# Patient Record
Sex: Male | Born: 1965 | Race: White | Hispanic: No | Marital: Single | Smoking: Current every day smoker
Health system: Southern US, Community
[De-identification: ages and names within clinical notes are randomized; demographics above are authoritative.]

## PROBLEM LIST (undated history)

## (undated) DIAGNOSIS — F419 Anxiety disorder, unspecified: Secondary | ICD-10-CM

## (undated) DIAGNOSIS — H409 Unspecified glaucoma: Secondary | ICD-10-CM

## (undated) DIAGNOSIS — M549 Dorsalgia, unspecified: Secondary | ICD-10-CM

## (undated) HISTORY — PX: NO PAST SURGERIES: SHX2092

---

## 2009-05-05 ENCOUNTER — Ambulatory Visit: Payer: Self-pay | Admitting: Internal Medicine

## 2009-05-10 ENCOUNTER — Ambulatory Visit: Payer: Self-pay | Admitting: Internal Medicine

## 2014-11-22 ENCOUNTER — Encounter: Payer: Self-pay | Admitting: *Deleted

## 2014-11-22 DIAGNOSIS — S3992XA Unspecified injury of lower back, initial encounter: Secondary | ICD-10-CM | POA: Diagnosis not present

## 2014-11-22 DIAGNOSIS — S4991XA Unspecified injury of right shoulder and upper arm, initial encounter: Secondary | ICD-10-CM | POA: Diagnosis present

## 2014-11-22 DIAGNOSIS — S40211A Abrasion of right shoulder, initial encounter: Secondary | ICD-10-CM | POA: Diagnosis not present

## 2014-11-22 DIAGNOSIS — Z720 Tobacco use: Secondary | ICD-10-CM | POA: Diagnosis not present

## 2014-11-22 DIAGNOSIS — Y9355 Activity, bike riding: Secondary | ICD-10-CM | POA: Diagnosis not present

## 2014-11-22 DIAGNOSIS — Y998 Other external cause status: Secondary | ICD-10-CM | POA: Diagnosis not present

## 2014-11-22 DIAGNOSIS — S42114A Nondisplaced fracture of body of scapula, right shoulder, initial encounter for closed fracture: Secondary | ICD-10-CM | POA: Insufficient documentation

## 2014-11-22 DIAGNOSIS — Y92410 Unspecified street and highway as the place of occurrence of the external cause: Secondary | ICD-10-CM | POA: Insufficient documentation

## 2014-11-22 MED ORDER — OXYCODONE-ACETAMINOPHEN 5-325 MG PO TABS
ORAL_TABLET | ORAL | Status: AC
  Administered 2014-11-22: 1 via ORAL
  Filled 2014-11-22: qty 1

## 2014-11-22 MED ORDER — OXYCODONE-ACETAMINOPHEN 5-325 MG PO TABS
1.0000 | ORAL_TABLET | Freq: Once | ORAL | Status: AC
  Administered 2014-11-22: 1 via ORAL
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
## 2014-11-23 ENCOUNTER — Emergency Department: Payer: BLUE CROSS/BLUE SHIELD

## 2014-11-23 ENCOUNTER — Emergency Department
Admission: EM | Admit: 2014-11-23 | Discharge: 2014-11-23 | Disposition: A | Discharge status: Home / Self Care | Payer: BLUE CROSS/BLUE SHIELD | Attending: Emergency Medicine | Admitting: Emergency Medicine

## 2014-11-23 DIAGNOSIS — S42101A Fracture of unspecified part of scapula, right shoulder, initial encounter for closed fracture: Secondary | ICD-10-CM

## 2014-11-23 HISTORY — DX: Unspecified glaucoma: H40.9

## 2014-11-23 MED ORDER — OXYCODONE-ACETAMINOPHEN 5-325 MG PO TABS
2.0000 | ORAL_TABLET | ORAL | Status: AC
  Administered 2014-11-23: 2 via ORAL

## 2014-11-23 MED ORDER — OXYCODONE-ACETAMINOPHEN 5-325 MG PO TABS
ORAL_TABLET | ORAL | Status: AC
  Filled 2014-11-23: qty 2

## 2014-11-23 MED ORDER — OXYCODONE-ACETAMINOPHEN 5-325 MG PO TABS: 2.0000 | ORAL_TABLET | ORAL | Status: DC | PRN

## 2014-11-23 NOTE — Discharge Instructions (Signed)
As we discussed, we believe you have an isolated fracture to your right scapula.  Please read the included information.  If he develop any new or worsening symptoms that concern you, please return to the emergency department.  In the meantime, use her shoulder immobilizer as much as possible to reduce pain and promote healing.  Follow-up is recommended with both your regular doctor as scheduled and with Dr. Roland Rack, the orthopedic specialist.   Scapular Fracture You have a fracture (break in bone) of your scapula. This is your shoulder blade. It is the large flat bone behind your shoulder. This is also the bone that makes up the ball and socket joint of your shoulder. Most of the time surgery is not required for injuries to this bone unless the socket of the shoulder joint is involved. DIAGNOSIS  Because of the severity of force usually required to break this bone, x-rays are often taken of other bones likely to be injured at the same time. X-rays of the hip, knee, and pelvis may be taken. Specialized x-rays (arteriograms) may be needed if there are injuries to large blood vessels associated with this injury. HOME CARE INSTRUCTIONS   Simple fractures of the scapula can be treated with a sling and swathe type of immobilization. This means the involved area is held in place by putting the arm in a sling. A wrap is made around the upper arm with the sling holding the arm next to the chest. This may be removed for bathing as instructed by your caregiver.  Apply ice to the injury for 15-20 minutes 03-04 times per day. Put the ice in a plastic bag. Place a towel between the bag of ice and your skin, splint, or immobilization device.  Do not resume use until instructed by your caregiver. Usually full rehabilitation (exercises to improve the injury site) will begin sometime after the sling and swathe are removed. Then begin use gradually as directed. Do not increase use to the point of pain. If pain develops,  decrease use and continue the above measures. Slowly increase activities that do not cause discomfort until you gradually achieve normal use without pain.  Only take over-the-counter or prescription medicines for pain, discomfort, or fever as directed by your caregiver. SEEK IMMEDIATE MEDICAL CARE IF:   Your pain and swelling increase and is uncontrolled with medications.  You develop new, unexplained symptoms or an increase of the symptoms which brought you to your caregiver.  You develop shortness or breath or cough up blood.  You are unable to move your arm or fingers. You develop warmth and swelling in your affected arm.  You develop an unexplained temperature. Document Released: 07/03/2005 Document Revised: 09/25/2011 Document Reviewed: 05/25/2006 [facility] Patient Information 2015 [place], [place]. This information is not intended to replace advice given to you by your health care provider. Make sure you discuss any questions you have with your health care provider.
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
## 2016-05-16 ENCOUNTER — Ambulatory Visit
Admission: RE | Admit: 2016-05-16 | Payer: BLUE CROSS/BLUE SHIELD | Source: Ambulatory Visit | Admitting: Gastroenterology

## 2016-05-18 ENCOUNTER — Encounter: Payer: Self-pay | Admitting: *Deleted

## 2016-05-19 ENCOUNTER — Encounter: Payer: Self-pay | Admitting: *Deleted

## 2016-05-19 ENCOUNTER — Ambulatory Visit
Admission: RE | Admit: 2016-05-19 | Discharge: 2016-05-19 | Disposition: A | Discharge status: Home / Self Care | Payer: BLUE CROSS/BLUE SHIELD | Source: Ambulatory Visit | Attending: Gastroenterology | Admitting: Gastroenterology

## 2016-05-19 ENCOUNTER — Ambulatory Visit: Payer: BLUE CROSS/BLUE SHIELD | Admitting: Anesthesiology

## 2016-05-19 ENCOUNTER — Encounter
Admission: RE | Disposition: A | Discharge status: Home / Self Care | Payer: Self-pay | Source: Ambulatory Visit | Attending: Gastroenterology

## 2016-05-19 ENCOUNTER — Encounter: Admission: RE | Payer: Self-pay | Source: Ambulatory Visit

## 2016-05-19 DIAGNOSIS — F17200 Nicotine dependence, unspecified, uncomplicated: Secondary | ICD-10-CM | POA: Diagnosis not present

## 2016-05-19 DIAGNOSIS — F419 Anxiety disorder, unspecified: Secondary | ICD-10-CM | POA: Insufficient documentation

## 2016-05-19 DIAGNOSIS — Z7982 Long term (current) use of aspirin: Secondary | ICD-10-CM | POA: Diagnosis not present

## 2016-05-19 DIAGNOSIS — H409 Unspecified glaucoma: Secondary | ICD-10-CM | POA: Insufficient documentation

## 2016-05-19 DIAGNOSIS — Z1211 Encounter for screening for malignant neoplasm of colon: Secondary | ICD-10-CM | POA: Diagnosis present

## 2016-05-19 DIAGNOSIS — Z79899 Other long term (current) drug therapy: Secondary | ICD-10-CM | POA: Insufficient documentation

## 2016-05-19 DIAGNOSIS — D128 Benign neoplasm of rectum: Secondary | ICD-10-CM | POA: Diagnosis not present

## 2016-05-19 HISTORY — PX: COLONOSCOPY WITH PROPOFOL: SHX5780

## 2016-05-19 HISTORY — DX: Anxiety disorder, unspecified: F41.9

## 2016-05-19 SURGERY — COLONOSCOPY WITH PROPOFOL
Anesthesia: General

## 2016-05-19 MED ORDER — MIDAZOLAM HCL 2 MG/2ML IJ SOLN
INTRAMUSCULAR | Status: DC | PRN
  Administered 2016-05-19 (×2): 1 mg via INTRAVENOUS

## 2016-05-19 MED ORDER — SODIUM CHLORIDE 0.9 % IV SOLN: INTRAVENOUS | Status: DC

## 2016-05-19 MED ORDER — FENTANYL CITRATE (PF) 100 MCG/2ML IJ SOLN
INTRAMUSCULAR | Status: DC | PRN
  Administered 2016-05-19: 50 ug via INTRAVENOUS
  Administered 2016-05-19: 25 ug via INTRAVENOUS

## 2016-05-19 MED ORDER — PHENYLEPHRINE HCL 10 MG/ML IJ SOLN
INTRAMUSCULAR | Status: DC | PRN
  Administered 2016-05-19: 100 ug via INTRAVENOUS

## 2016-05-19 MED ORDER — PROPOFOL 10 MG/ML IV BOLUS
INTRAVENOUS | Status: DC | PRN
  Administered 2016-05-19: 30 mg via INTRAVENOUS
  Administered 2016-05-19: 20 mg via INTRAVENOUS
  Administered 2016-05-19: 30 mg via INTRAVENOUS
  Administered 2016-05-19: 20 mg via INTRAVENOUS

## 2016-05-19 MED ORDER — PROPOFOL 500 MG/50ML IV EMUL
INTRAVENOUS | Status: DC | PRN
  Administered 2016-05-19: 120 ug/kg/min via INTRAVENOUS

## 2016-05-19 MED ORDER — SODIUM CHLORIDE 0.9 % IV SOLN
INTRAVENOUS | Status: DC
  Administered 2016-05-19: 10:00:00 via INTRAVENOUS

## 2016-05-19 NOTE — H&P (Signed)
Outpatient short stay form Pre-procedure 05/19/2016 10:46 AM Lollie Sails MD  Primary Physician: Dr Callie Fielding  Reason for visit:  Colonoscopy  History of present illness:  Patient is a 50 year old male presenting today for a screening colonoscopy. This is his first colonoscopy. He tolerated his prep well. He does take 81 mg aspirin but is held that for several days. He takes no other aspirin products or blood thinning agents.    Current Facility-Administered Medications:  .  0.9 %  sodium chloride infusion, , Intravenous, Continuous, Lollie Sails, MD, Last Rate: 20 mL/hr at 05/19/16 1025 .  0.9 %  sodium chloride infusion, , Intravenous, Continuous, Lollie Sails, MD  Prescriptions Prior to Admission  Medication Sig Dispense Refill Last Dose  . aspirin 81 MG chewable tablet Chew by mouth daily.   05/16/2016  . clonazePAM (KLONOPIN) 0.5 MG tablet Take 0.5 mg by mouth 2 (two) times daily as needed for anxiety.   05/18/2016 at Unknown time  . latanoprost (XALATAN) 0.005 % ophthalmic solution Place 1 drop into both eyes at bedtime.   05/18/2016 at Unknown time  . oxyCODONE-acetaminophen (ROXICET) 5-325 MG per tablet Take 2 tablets by mouth every 4 (four) hours as needed for severe pain. (Patient not taking: Reported on 05/19/2016) 30 tablet 0 Not Taking at Unknown time  . rosuvastatin (CRESTOR) 10 MG tablet Take 10 mg by mouth daily.   Not Taking at Unknown time  . sertraline (ZOLOFT) 50 MG tablet Take 50 mg by mouth daily.   Not Taking at Unknown time  . traZODone (DESYREL) 50 MG tablet Take 50 mg by mouth at bedtime.   Not Taking at Unknown time     Allergies  Allergen Reactions  . Crestor [Rosuvastatin Calcium]   . Lipitor [Atorvastatin]   . Zocor [Simvastatin]      Past Medical History:  Diagnosis Date  . Anxiety   . Glaucoma   . Glaucoma     Review of systems:      Physical Exam    Heart and lungs: Regular rate and rhythm without rub or gallop, lungs are  bilaterally clear. Minimal basilar crackles with clearing to cough    HEENT: Normal cephalic atraumatic eyes are anicteric    Other:     Pertinant exam for procedure: Soft nontender nondistended bowel sounds positive normoactive    Planned proceedures: Colonoscopy and indicated procedures. I have discussed the risks benefits and complications of procedures to include not limited to bleeding, infection, perforation and the risk of sedation and the patient wishes to proceed.  Lollie Sails, MD  10:55 AM 05/19/2016    Lollie Sails, MD Gastroenterology 05/19/2016  10:46 AM

## 2016-05-19 NOTE — Anesthesia Preprocedure Evaluation (Signed)
Anesthesia Evaluation  Patient identified by MRN, date of birth, ID band Patient awake    Reviewed: Allergy & Precautions, NPO status , Patient's Chart, lab work & pertinent test results  Airway Mallampati: II       Dental  (+) Teeth Intact   Pulmonary neg pulmonary ROS, Current Smoker,    breath sounds clear to auscultation       Cardiovascular Exercise Tolerance: Good negative cardio ROS   Rhythm:Regular Rate:Normal     Neuro/Psych Anxiety    GI/Hepatic negative GI ROS, Neg liver ROS,   Endo/Other  negative endocrine ROS  Renal/GU negative Renal ROS     Musculoskeletal negative musculoskeletal ROS (+)   Abdominal Normal abdominal exam  (+)   Peds  Hematology negative hematology ROS (+)   Anesthesia Other Findings   Reproductive/Obstetrics                             Anesthesia Physical Anesthesia Plan  ASA: II  Anesthesia Plan: General   Post-op Pain Management:    Induction: Intravenous  Airway Management Planned: Natural Airway and Nasal Cannula  Additional Equipment:   Intra-op Plan:   Post-operative Plan:   Informed Consent: I have reviewed the patients History and Physical, chart, labs and discussed the procedure including the risks, benefits and alternatives for the proposed anesthesia with the patient or authorized representative who has indicated his/her understanding and acceptance.     Plan Discussed with: CRNA  Anesthesia Plan Comments:         Anesthesia Quick Evaluation

## 2016-05-19 NOTE — Transfer of Care (Signed)
Immediate Anesthesia Transfer of Care Note  Patient: Wayne Orr  Procedure(s) Performed: Procedure(s): COLONOSCOPY WITH PROPOFOL (N/A)  Patient Location: PACU  Anesthesia Type:General  Level of Consciousness: awake and alert   Airway & Oxygen Therapy: Patient Spontanous Breathing and Patient connected to nasal cannula oxygen  Post-op Assessment: Report given to RN  Post vital signs: Reviewed and stable  Last Vitals:  Vitals:   05/19/16 1006 05/19/16 1134  BP: 126/77 107/67  Pulse: 60 72  Resp: 18   Temp: 36.4 C (!) 36.1 C    Last Pain:  Vitals:   05/19/16 1134  TempSrc: Axillary         Complications: No apparent anesthesia complications

## 2016-05-19 NOTE — Anesthesia Postprocedure Evaluation (Signed)
Anesthesia Post Note  Patient: Wayne Orr  Procedure(s) Performed: Procedure(s) (LRB): COLONOSCOPY WITH PROPOFOL (N/A)  Patient location during evaluation: PACU Anesthesia Type: General Level of consciousness: awake Pain management: pain level controlled Vital Signs Assessment: post-procedure vital signs reviewed and stable Respiratory status: spontaneous breathing Cardiovascular status: stable Anesthetic complications: no    Last Vitals:  Vitals:   05/19/16 1150 05/19/16 1200  BP: 114/88 (!) 101/54  Pulse:    Resp:    Temp:      Last Pain:  Vitals:   05/19/16 1134  TempSrc: Axillary                 VAN STAVEREN,[name]

## 2016-05-19 NOTE — Op Note (Addendum)
[facility] Gastroenterology Patient Name: Wayne Orr Procedure Date: 05/19/2016 10:56 AM MRN: [id number] Account #: [id number] Date of Birth: March 22, 1966 Admit Type: Outpatient Age: 50 Room: [facility] ENDO ROOM 3 Gender: Male Note Status: Supervisor Override Procedure:            Colonoscopy Indications:          Screening for colorectal malignant neoplasm, This is                        the patient's first colonoscopy Providers:            Lollie Sails, MD Referring MD:         Sherrin Daisy, MD Medicines:            Monitored Anesthesia Care Complications:        No immediate complications. Procedure:            Pre-Anesthesia Assessment:                       - ASA Grade Assessment: II - A patient with mild                        systemic disease.                       After obtaining informed consent, the colonoscope was                        passed under direct vision. Throughout the procedure,                        the patient's blood pressure, pulse, and oxygen                        saturations were monitored continuously. The                        Colonoscope was introduced through the anus and                        advanced to the the cecum, identified by appendiceal                        orifice and ileocecal valve. The colonoscopy was                        performed without difficulty. The patient tolerated the                        procedure well. The quality of the bowel preparation                        was good. Findings:      A 2 mm polyp was found in the rectum. The polyp was sessile. The polyp       was removed with a cold biopsy forceps. Resection and retrieval were       complete.      A 2 mm polyp was found in the distal sigmoid colon. The polyp was       sessile. The polyp was removed with a cold biopsy forceps. Resection and  retrieval were complete.      No additional abnormalities were found on retroflexion.      The  digital rectal exam was normal. Note non-inflamed anal skin tag.      The exam was otherwise without abnormality. Impression:           - One 2 mm polyp in the rectum, removed with a cold                        biopsy forceps. Resected and retrieved.                       - One 2 mm polyp in the distal sigmoid colon, removed                        with a cold biopsy forceps. Resected and retrieved.                       - The examination was otherwise normal. Recommendation:       - Discharge patient to home. Procedure Code(s):    --- Professional ---                       [phone], Colonoscopy, flexible; with biopsy, single or                        multiple Diagnosis Code(s):    --- Professional ---                       Z12.11, Encounter for screening for malignant neoplasm                        of colon                       K62.1, Rectal polyp                       D12.5, Benign neoplasm of sigmoid colon CPT copyright 2016 [employer]. All rights reserved. The codes documented in this report are preliminary and upon coder review may  be revised to meet current compliance requirements. Lollie Sails, MD 05/19/2016 11:30:51 AM This report has been signed electronically. Number of Addenda: 0 Note Initiated On: 05/19/2016 10:56 AM Scope Withdrawal Time: 0 hours 9 minutes 36 seconds  Total Procedure Duration: 0 hours 16 minutes 3 seconds       [facility]

## 2016-05-22 ENCOUNTER — Encounter: Payer: Self-pay | Admitting: Gastroenterology

## 2016-05-22 LAB — SURGICAL PATHOLOGY

## 2016-07-09 ENCOUNTER — Encounter: Payer: Self-pay | Admitting: Gynecology

## 2016-07-09 ENCOUNTER — Ambulatory Visit
Admission: EM | Admit: 2016-07-09 | Discharge: 2016-07-09 | Disposition: A | Discharge status: Home / Self Care | Payer: BLUE CROSS/BLUE SHIELD | Attending: Family Medicine | Admitting: Family Medicine

## 2016-07-09 DIAGNOSIS — S39012A Strain of muscle, fascia and tendon of lower back, initial encounter: Secondary | ICD-10-CM | POA: Diagnosis not present

## 2016-07-09 HISTORY — DX: Dorsalgia, unspecified: M54.9

## 2016-07-09 MED ORDER — MELOXICAM 15 MG PO TABS: 15.0000 mg | ORAL_TABLET | Freq: Every day | ORAL | 0 refills | Status: DC | PRN

## 2016-07-09 MED ORDER — KETOROLAC TROMETHAMINE 60 MG/2ML IM SOLN
60.0000 mg | Freq: Once | INTRAMUSCULAR | Status: AC
  Administered 2016-07-09: 60 mg via INTRAMUSCULAR

## 2016-07-09 MED ORDER — CYCLOBENZAPRINE HCL 10 MG PO TABS: 10.0000 mg | ORAL_TABLET | Freq: Two times a day (BID) | ORAL | 0 refills | Status: DC | PRN

## 2016-07-09 NOTE — Discharge Instructions (Signed)
Take medication as prescribed. Rest. Drink plenty of fluids. Avoid strenuous activity. Stretch.  Follow up with your primary care physician this week. Return to Urgent care for new or worsening concerns.

## 2016-07-09 NOTE — ED Triage Notes (Signed)
Per patient has a history of chronic back. Per patient lower back pain started x yesterday.
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
## 2016-09-24 ENCOUNTER — Emergency Department
Admission: EM | Admit: 2016-09-24 | Discharge: 2016-09-24 | Disposition: A | Discharge status: Home / Self Care | Payer: BLUE CROSS/BLUE SHIELD | Attending: Emergency Medicine | Admitting: Emergency Medicine

## 2016-09-24 ENCOUNTER — Encounter: Payer: Self-pay | Admitting: Emergency Medicine

## 2016-09-24 ENCOUNTER — Ambulatory Visit (INDEPENDENT_AMBULATORY_CARE_PROVIDER_SITE_OTHER)
Admission: EM | Admit: 2016-09-24 | Discharge: 2016-09-24 | Disposition: A | Discharge status: Other Acute Inpatient Hospital | Payer: BLUE CROSS/BLUE SHIELD | Source: Home / Self Care

## 2016-09-24 ENCOUNTER — Other Ambulatory Visit: Payer: Self-pay

## 2016-09-24 DIAGNOSIS — F419 Anxiety disorder, unspecified: Secondary | ICD-10-CM | POA: Insufficient documentation

## 2016-09-24 DIAGNOSIS — H409 Unspecified glaucoma: Secondary | ICD-10-CM | POA: Insufficient documentation

## 2016-09-24 DIAGNOSIS — Z7982 Long term (current) use of aspirin: Secondary | ICD-10-CM | POA: Insufficient documentation

## 2016-09-24 DIAGNOSIS — F17210 Nicotine dependence, cigarettes, uncomplicated: Secondary | ICD-10-CM | POA: Insufficient documentation

## 2016-09-24 DIAGNOSIS — Z79899 Other long term (current) drug therapy: Secondary | ICD-10-CM | POA: Diagnosis not present

## 2016-09-24 DIAGNOSIS — I480 Paroxysmal atrial fibrillation: Secondary | ICD-10-CM | POA: Diagnosis not present

## 2016-09-24 DIAGNOSIS — I4891 Unspecified atrial fibrillation: Secondary | ICD-10-CM

## 2016-09-24 DIAGNOSIS — R002 Palpitations: Secondary | ICD-10-CM | POA: Diagnosis present

## 2016-09-24 LAB — CBC
HCT: 53.9 % — ABNORMAL HIGH (ref 40.0–52.0)
Hemoglobin: 18.8 g/dL — ABNORMAL HIGH (ref 13.0–18.0)
MCH: 30.1 pg (ref 26.0–34.0)
MCHC: 34.8 g/dL (ref 32.0–36.0)
MCV: 86.6 fL (ref 80.0–100.0)
PLATELETS: 300 10*3/uL (ref 150–440)
RBC: 6.22 MIL/uL — AB (ref 4.40–5.90)
RDW: 12.7 % (ref 11.5–14.5)
WBC: 8.6 10*3/uL (ref 3.8–10.6)

## 2016-09-24 LAB — BASIC METABOLIC PANEL
ANION GAP: 7 (ref 5–15)
BUN: 11 mg/dL (ref 6–20)
CALCIUM: 9.7 mg/dL (ref 8.9–10.3)
CO2: 28 mmol/L (ref 22–32)
CREATININE: 1.07 mg/dL (ref 0.61–1.24)
Chloride: 105 mmol/L (ref 101–111)
Glucose, Bld: 111 mg/dL — ABNORMAL HIGH (ref 65–99)
Potassium: 4.3 mmol/L (ref 3.5–5.1)
SODIUM: 140 mmol/L (ref 135–145)

## 2016-09-24 LAB — TROPONIN I: Troponin I: <0.03 ng/mL (ref <0.03)

## 2016-09-24 MED ORDER — SODIUM CHLORIDE 0.9 % IV BOLUS (SEPSIS)
1000.0000 mL | Freq: Once | INTRAVENOUS | Status: AC
  Administered 2016-09-24: 1000 mL via INTRAVENOUS

## 2016-09-24 MED ORDER — DILTIAZEM HCL 25 MG/5ML IV SOLN
INTRAVENOUS | Status: AC
  Administered 2016-09-24: 10 mg via INTRAVENOUS
  Filled 2016-09-24: qty 5

## 2016-09-24 MED ORDER — DILTIAZEM HCL 100 MG IV SOLR
5.0000 mg/h | Freq: Once | INTRAVENOUS | Status: DC
  Filled 2016-09-24: qty 100

## 2016-09-24 MED ORDER — DILTIAZEM HCL 25 MG/5ML IV SOLN
10.0000 mg | Freq: Once | INTRAVENOUS | Status: AC
  Administered 2016-09-24: 10 mg via INTRAVENOUS

## 2016-09-24 MED ORDER — DILTIAZEM HCL 25 MG/5ML IV SOLN
10.0000 mg | Freq: Once | INTRAVENOUS | Status: AC
Start: 2016-09-24 — End: 2016-09-24
  Administered 2016-09-24: 10 mg via INTRAVENOUS

## 2016-09-24 MED ORDER — METOPROLOL TARTRATE 25 MG PO TABS: 12.5000 mg | ORAL_TABLET | Freq: Two times a day (BID) | ORAL | 0 refills | Status: AC

## 2016-09-24 MED ORDER — LORAZEPAM 2 MG/ML IJ SOLN
0.5000 mg | Freq: Once | INTRAMUSCULAR | Status: AC
  Administered 2016-09-24: 0.5 mg via INTRAVENOUS
  Filled 2016-09-24: qty 1

## 2016-09-24 NOTE — ED Provider Notes (Addendum)
[facility] Emergency Department Provider Note  Time seen: 3:20 PM  I have reviewed the triage vital signs and the nursing notes.   HISTORY  Chief Complaint Irregular Heart Beat    HPI Wayne Orr is a 51 y.o. male with a past medical history of anxiety who presents to the emergency department with atrial fibrillation with rapid ventricular response. According to the patient this morning he felt his heart fluttering and felt like it was racing in his chest. He went to urgent care and was referred to the emergency department for new onset atrial fibrillation with rapid ventricular response. Patient denies any chest pain. He does state shortness of breath. Patient states he has been having intermittent palpitations for years they've told him that he could be going into atrial fibrillation but they were never able to capture on a cardiac monitor. Patient states normally his symptoms resolved in less than an hour but today they will not resolve. Upon arrival patient has a heart rate around 160 bpm, states mild shortness of breath but denies any chest pain.  Past Medical History:  Diagnosis Date  . Anxiety   . Back pain   . Glaucoma   . Glaucoma     There are no active problems to display for this patient.   Past Surgical History:  Procedure Laterality Date  . COLONOSCOPY WITH PROPOFOL N/A 05/19/2016   Procedure: COLONOSCOPY WITH PROPOFOL;  Surgeon: Lollie Sails, MD;  Location: [facility] ENDOSCOPY;  Service: Endoscopy;  Laterality: N/A;  . NO PAST SURGERIES      Prior to Admission medications   Medication Sig Start Date End Date Taking? Authorizing Provider  aspirin 81 MG chewable tablet Chew by mouth daily.    Historical Provider, MD  Aspirin-Caffeine (BAYER BACK & BODY PAIN EX ST) 500-32.5 MG TABS Take by mouth.    Historical Provider, MD  clonazePAM (KLONOPIN) 0.5 MG tablet Take 0.5 mg by mouth 2 (two) times daily as needed for anxiety.    Historical  Provider, MD  gemfibrozil (LOPID) 600 MG tablet Take 600 mg by mouth 2 (two) times daily before a meal.    Historical Provider, MD  latanoprost (XALATAN) 0.005 % ophthalmic solution Place 1 drop into both eyes at bedtime.    Historical Provider, MD    Allergies  Allergen Reactions  . Crestor [Rosuvastatin Calcium]   . Lipitor [Atorvastatin]   . Zocor [Simvastatin]     History reviewed. No pertinent family history.  Social History Social History  Substance Use Topics  . Smoking status: Current Every Day Smoker    Types: Cigarettes  . Smokeless tobacco: Never Used  . Alcohol use Yes     Comment: rarely/social    Review of Systems Constitutional: Negative for fever. Cardiovascular: Negative for chest pain.Positive for palpitations Respiratory: Positive for shortness of breath Gastrointestinal: Negative for abdominal pain Neurological: Negative for headache 10-point ROS otherwise negative.  ____________________________________________   PHYSICAL EXAM:  VITAL SIGNS: ED Triage Vitals  Enc Vitals Group     BP 09/24/16 1327 (!) 136/99     Pulse Rate 09/24/16 1327 80     Resp 09/24/16 1327 14     Temp 09/24/16 1327 97.8 F (36.6 C)     Temp Source 09/24/16 1327 Oral     SpO2 09/24/16 1327 98 %     Weight 09/24/16 1328 175 lb (79.4 kg)     Height 09/24/16 1328 5\' 9"  (1.753 m)     Head  Circumference --      Peak Flow --      Pain Score 09/24/16 1328 0     Pain Loc --      Pain Edu? --      Excl. in [place]? --     Constitutional: Alert and oriented. Well appearing and in no distress. Eyes: Normal exam ENT   Head: Normocephalic and atraumatic.   Mouth/Throat: Mucous membranes are moist. Cardiovascular: Irregular rhythm, rate around 150 bpm. No murmur. Respiratory: Normal respiratory effort without tachypnea nor retractions. Breath sounds are clear  Gastrointestinal: Soft and nontender. No distention.   Musculoskeletal: Nontender with normal range of motion in all  extremities. No lower extremity tenderness or edema. Neurologic:  Normal speech and language. No gross focal neurologic deficits are appreciated. Skin:  Skin is warm, dry and intact.  Psychiatric: Mood and affect are normal. Speech and behavior are normal.   ____________________________________________    EKG  EKG reviewed and interpreted by myself shows atrial fibrillation with rapid ventricular response of 155 bpm, narrow QRS, normal axis, no ST elevation.  ____________________________________________   INITIAL IMPRESSION / ASSESSMENT AND PLAN / ED COURSE  Pertinent labs & imaging results that were available during my care of the patient were reviewed by me and considered in my medical decision making (see chart for details).  The patient presents to the emergency department with new onset atrial fibrillation with rapid ventricular sponsor 150 bpm. Patient states mild shortness of breath otherwise denies any symptoms. States he can feel his heart fluttering in his chest which is somewhat anxiety provoking for the patient. Patient's labs are largely within normal limits besides a mild increase in his hemoglobin/hematocrit. Patient's troponin is negative. Has received 2 rounds of IV diltiazem, heart rate was as low as 100 bpm but has now returned to approximately 120-140 bpm. We will start the patient on a diltiazem drip and admitted to the hospital for further treatment.  CRITICAL CARE Performed by: Harvest Dark   Total critical care time: 30 minutes  Critical care time was exclusive of separately billable procedures and treating other patients.  Critical care was necessary to treat or prevent imminent or life-threatening deterioration.  Critical care was time spent personally by me on the following activities: development of treatment plan with patient and/or surrogate as well as nursing, discussions with consultants, evaluation of patient's response to treatment, examination  of patient, obtaining history from patient or surrogate, ordering and performing treatments and interventions, ordering and review of laboratory studies, ordering and review of radiographic studies, pulse oximetry and re-evaluation of patient's condition.   As spontaneously converted back to normal sinus rhythm prior to diltiazem dripping started. Patient adamantly does not want to be admitted to the hospital. States he feels much better. Patient currently normal sinus rhythm at 78 bpm. Patient has follow-up with cardiology. We'll discharge the patient. I discussed return precautions for any further palpitations, rapid heart rate, chest pain or shortness of breath.   ____________________________________________   FINAL CLINICAL IMPRESSION(S) / ED DIAGNOSES  New onset atrial fibrillation with rapid ventricular response    Harvest Dark, MD 09/24/16 [place], MD 09/24/16 1553

## 2016-09-24 NOTE — ED Notes (Signed)
Called pharmacy about cardizem drip.

## 2016-09-24 NOTE — Discharge Instructions (Signed)
Recommend patient go to hospital emergency department by ambulance (patient refused; risks explained and patient verbalizes understanding)

## 2016-09-24 NOTE — ED Notes (Signed)
Pt stated he needs to urinate. Informed that with his high HR this RN does not want him standing up by himself to urinate. Given urinal and instructed to sit on side of bed. Will come back in a few minutes to check on pt. Pt still hooked up to cardiac monitor while sitting on side of bed.

## 2016-09-24 NOTE — ED Notes (Signed)
Pt HR bouncing anywhere from 99 to 165. Dr. Kerman Passey notified.

## 2016-09-24 NOTE — ED Notes (Signed)
Pt states that he felt his heart racing this morning. States it usually starts when he takes a deep breath or moves a certain way. States usually can get himself back into normal rhythm by breathing out slowly, has never tried to bear down to get back to normal. Pt states he feels anxious, but denies CP or SOB. States not on any a fib medications because he hasn't been officially diagnosed, they've never been able to catch him in a fib. States he started juicing recently and felt weird after juicing last night and felt worse this AM so went to [place]. Also drank coffee this AM. Also states that cholesterol medication has made him feel this way before.

## 2016-09-24 NOTE — ED Notes (Signed)
Pt maintaining NSR. Dr. Kerman Passey to DC

## 2016-09-24 NOTE — ED Triage Notes (Signed)
PT states he was at [facility] and was sent here for rapid a fib. Pt has been feeling this way since this AM, states had coffee this AM and had some pop and beers last night. Pt is alert and oriented x 4. No distress noted. Denies CP or SOB. Pt has been working with Dr. Nehemiah Massed trying to catch him in a fib with no luck until today, was told previously maybe he was having PVC's, has had a stress test recently.

## 2016-09-24 NOTE — ED Triage Notes (Signed)
Patient reports palpitations that started early this morning.  Patient denies chest pain.

## 2016-09-24 NOTE — ED Provider Notes (Signed)
[facility]    CSN: [id number] Arrival date & time: 09/24/16  1224     History   Chief Complaint Chief Complaint  Patient presents with  . Palpitations    HPI Wayne Orr is a 51 y.o. male.   51 yo male with c/o "palpitations" that started about 2 hours ago. States heart feels fast. Denies any chest pains, shortness of breath, fainting. States he's had similar episodes in the past, however states his tests in the past have been normal.     Palpitations  Palpitations quality:  Fast Onset quality:  Sudden Duration:  2 hours Timing:  Constant Progression:  Unchanged Chronicity:  Recurrent Context: caffeine and nicotine   Relieved by:  None tried Ineffective treatments:  None tried Associated symptoms: no back pain, no chest pain, no chest pressure, no cough, no diaphoresis, no dizziness, no hemoptysis, no leg pain, no lower extremity edema, no malaise/fatigue, no nausea, no near-syncope, no numbness, no orthopnea, no PND, no shortness of breath, no syncope, no vomiting and no weakness   Risk factors: no diabetes mellitus, no heart disease and no hx of DVT     Past Medical History:  Diagnosis Date  . Anxiety   . Back pain   . Glaucoma   . Glaucoma     There are no active problems to display for this patient.   Past Surgical History:  Procedure Laterality Date  . COLONOSCOPY WITH PROPOFOL N/A 05/19/2016   Procedure: COLONOSCOPY WITH PROPOFOL;  Surgeon: Lollie Sails, MD;  Location: [facility] ENDOSCOPY;  Service: Endoscopy;  Laterality: N/A;  . NO PAST SURGERIES         Home Medications    Prior to Admission medications   Medication Sig Start Date End Date Taking? Authorizing Provider  gemfibrozil (LOPID) 600 MG tablet Take 600 mg by mouth 2 (two) times daily before a meal.   Yes Historical Provider, MD  aspirin 81 MG chewable tablet Chew by mouth daily.    Historical Provider, MD  Aspirin-Caffeine (BAYER BACK & BODY PAIN EX ST) 500-32.5 MG TABS  Take by mouth.    Historical Provider, MD  clonazePAM (KLONOPIN) 0.5 MG tablet Take 0.5 mg by mouth 2 (two) times daily as needed for anxiety.    Historical Provider, MD  latanoprost (XALATAN) 0.005 % ophthalmic solution Place 1 drop into both eyes at bedtime.    Historical Provider, MD    Family History History reviewed. No pertinent family history.  Social History Social History  Substance Use Topics  . Smoking status: Current Every Day Smoker    Types: Cigarettes  . Smokeless tobacco: Never Used  . Alcohol use Yes     Comment: rarely/social     Allergies   Crestor [rosuvastatin calcium]; Lipitor [atorvastatin]; and Zocor [simvastatin]   Review of Systems Review of Systems  Constitutional: Negative for diaphoresis and malaise/fatigue.  Respiratory: Negative for cough, hemoptysis and shortness of breath.   Cardiovascular: Positive for palpitations. Negative for chest pain, orthopnea, syncope, PND and near-syncope.  Gastrointestinal: Negative for nausea and vomiting.  Musculoskeletal: Negative for back pain.  Neurological: Negative for dizziness, weakness and numbness.     Physical Exam Triage Vital Signs ED Triage Vitals  Enc Vitals Group     BP 09/24/16 1234 112/81     Pulse Rate 09/24/16 1234 83     Resp 09/24/16 1234 16     Temp 09/24/16 1234 98 F (36.7 C)     Temp Source 09/24/16  1234 Oral     SpO2 09/24/16 1234 99 %     Weight 09/24/16 1231 175 lb (79.4 kg)     Height 09/24/16 1231 5\' 9"  (1.753 m)     Head Circumference --      Peak Flow --      Pain Score 09/24/16 1234 0     Pain Loc --      Pain Edu? --      Excl. in [place]? --    No data found.   Updated Vital Signs BP 112/81 (BP Location: Left Arm)   Pulse 83   Temp 98 F (36.7 C) (Oral)   Resp 16   Ht 5\' 9"  (1.753 m)   Wt 175 lb (79.4 kg)   SpO2 99%   BMI 25.84 kg/m   Visual Acuity Right Eye Distance:   Left Eye Distance:   Bilateral Distance:    Right Eye Near:   Left Eye Near:      Bilateral Near:     Physical Exam  Constitutional: He appears well-developed and well-nourished. No distress.  Cardiovascular: An irregularly irregular rhythm present. Tachycardia present.  PMI is not displaced.  Exam reveals no friction rub.   No murmur heard. Skin: He is not diaphoretic.  Nursing note and vitals reviewed.    UC Treatments / Results  Labs (all labs ordered are listed, but only abnormal results are displayed) Labs Reviewed - No data to display  EKG  EKG Interpretation None       Radiology No results found.  Procedures .EKG Date/Time: 09/24/2016 1:03 PM Performed by: Norval Gable Authorized by: Norval Gable   ECG reviewed by ED Physician in the absence of a cardiologist: yes   Previous ECG:    Previous ECG:  Unavailable Interpretation:    Interpretation: abnormal   Rate:    ECG rate:  128   ECG rate assessment: tachycardic   Rhythm:    Rhythm: atrial fibrillation   QRS:    QRS axis:  Normal Conduction:    Conduction: abnormal   ST segments:    ST segments:  Normal    (including critical care time)  Medications Ordered in UC Medications - No data to display   Initial Impression / Assessment and Plan / UC Course  I have reviewed the triage vital signs and the nursing notes.  Pertinent labs & imaging results that were available during my care of the patient were reviewed by me and considered in my medical decision making (see chart for details).       Final Clinical Impressions(s) / UC Diagnoses   Final diagnoses:  Atrial fibrillation with rapid ventricular response [facility])    New Prescriptions Discharge Medication List as of 09/24/2016 12:54 PM      1. EKG result and diagnosis reviewed with patient; explained diagnosis with patient and risks associated with it. Recommend patient go to Emergency Department by EMS for further evaluation and management, however patient refuses to go by EMS. States he may even "wait until  tomorrow to call my PCP and follow up with them". RN present in the room when I explained to patient my medical advice as above.  Patient left facility against medical advice. Report called to [facility] ED triage RN in case patient presents to ED by private vehicle.    Norval Gable, MD 09/24/16 1343

## 2016-09-24 NOTE — ED Notes (Signed)
Pt stating he does not want to be admitted. This RN asked pt if this RN could start an IV dedicated to the cardizem drip, pt refused at this time. Ativan given because pt c/o of anxiety, states his anxiety is causing his a fib. States he is feeling a lot better and wants to go home. Dr. Kerman Passey and admitting MD notified. Dr. Kerman Passey states watch pt for 15-20 minutes to make sure pt stays out of a fib. If pt goes back into a fib then he will need to stay. Pt sates he will "sign whatever to get out of here." Will continue to monitor heart rhythm for next 20 minutes.

## 2017-02-20 ENCOUNTER — Ambulatory Visit
Admission: EM | Admit: 2017-02-20 | Discharge: 2017-02-20 | Disposition: A | Discharge status: Home / Self Care | Payer: BLUE CROSS/BLUE SHIELD | Attending: Family Medicine | Admitting: Family Medicine

## 2017-02-20 DIAGNOSIS — A084 Viral intestinal infection, unspecified: Secondary | ICD-10-CM

## 2017-02-20 DIAGNOSIS — M5432 Sciatica, left side: Secondary | ICD-10-CM

## 2017-02-20 DIAGNOSIS — F411 Generalized anxiety disorder: Secondary | ICD-10-CM | POA: Diagnosis not present

## 2017-02-20 LAB — COMPREHENSIVE METABOLIC PANEL
ALK PHOS: 78 U/L (ref 38–126)
ALT: 30 U/L (ref 17–63)
ANION GAP: 6 (ref 5–15)
AST: 22 U/L (ref 15–41)
Albumin: 4.5 g/dL (ref 3.5–5.0)
BUN: 16 mg/dL (ref 6–20)
CALCIUM: 9.3 mg/dL (ref 8.9–10.3)
CO2: 28 mmol/L (ref 22–32)
CREATININE: 1.14 mg/dL (ref 0.61–1.24)
Chloride: 102 mmol/L (ref 101–111)
Glucose, Bld: 98 mg/dL (ref 65–99)
Potassium: 4.3 mmol/L (ref 3.5–5.1)
Sodium: 136 mmol/L (ref 135–145)
Total Bilirubin: 0.8 mg/dL (ref 0.3–1.2)
Total Protein: 7.8 g/dL (ref 6.5–8.1)

## 2017-02-20 MED ORDER — PREDNISONE 20 MG PO TABS: 20.0000 mg | ORAL_TABLET | Freq: Every day | ORAL | 0 refills | Status: DC

## 2017-02-20 MED ORDER — LORAZEPAM 1 MG PO TABS: ORAL_TABLET | ORAL | 0 refills | Status: AC

## 2017-02-20 NOTE — ED Triage Notes (Signed)
Patient stated this is the second week. Tried to get in with PCP, but could not get an appointment. !st week he thinks he may have had some fever, body aches. Also stated that his sciatica started acting up causing some pain going down right leg and pain on right side. Stated he is better, but still has some twitching in leg and foot/

## 2017-02-20 NOTE — Discharge Instructions (Signed)
Increase liquids Imodium AD as needed for diarrhea

## 2017-02-20 NOTE — ED Provider Notes (Signed)
[facility]    CSN: [id number] Arrival date & time: 02/20/17  8119     History   Chief Complaint Chief Complaint  Patient presents with  . Diarrhea  . Generalized Body Aches  . Abdominal Pain    HPI Wayne Orr is a 51 y.o. male.   51 yo male with a c/o 2 weeks h/o mild watery diarrhea which has improved slightly since yesterday. Also reports intermitten "muscle twitching" of legs and arms as well as flare up of his sciatica on the right and anxiety. Denies any fevers, chills, chest pains or shortness of breath.    The history is provided by the patient.  Diarrhea  Associated symptoms: abdominal pain   Abdominal Pain  Associated symptoms: diarrhea     Past Medical History:  Diagnosis Date  . Anxiety   . Back pain   . Glaucoma   . Glaucoma     There are no active problems to display for this patient.   Past Surgical History:  Procedure Laterality Date  . COLONOSCOPY WITH PROPOFOL N/A 05/19/2016   Procedure: COLONOSCOPY WITH PROPOFOL;  Surgeon: Lollie Sails, MD;  Location: [facility] ENDOSCOPY;  Service: Endoscopy;  Laterality: N/A;  . NO PAST SURGERIES         Home Medications    Prior to Admission medications   Medication Sig Start Date End Date Taking? Authorizing Provider  aspirin 81 MG chewable tablet Chew by mouth daily.    [provider]  Aspirin-Caffeine (BAYER BACK & BODY PAIN EX ST) 500-32.5 MG TABS Take by mouth.    [provider]  clonazePAM (KLONOPIN) 0.5 MG tablet Take 0.5 mg by mouth 2 (two) times daily as needed for anxiety.    [provider]  gemfibrozil (LOPID) 600 MG tablet Take 600 mg by mouth 2 (two) times daily before a meal.    [provider]  latanoprost (XALATAN) 0.005 % ophthalmic solution Place 1 drop into both eyes at bedtime.    [provider]  LORazepam (ATIVAN) 1 MG tablet 1 tab po qd prn 02/20/17   Norval Gable, MD  metoprolol tartrate (LOPRESSOR) 25 MG tablet Take  0.5 tablets (12.5 mg total) by mouth 2 (two) times daily. 09/24/16 09/24/17  Harvest Dark, MD  predniSONE (DELTASONE) 20 MG tablet Take 1 tablet (20 mg total) by mouth daily. 02/20/17   Norval Gable, MD    Family History No family history on file.  Social History Social History  Substance Use Topics  . Smoking status: Current Every Day Smoker    Packs/day: 0.50    Types: Cigarettes  . Smokeless tobacco: Never Used  . Alcohol use Yes     Comment: rarely/social     Allergies   Crestor [rosuvastatin calcium]; Lipitor [atorvastatin]; and Zocor [simvastatin]   Review of Systems Review of Systems  Gastrointestinal: Positive for abdominal pain and diarrhea.     Physical Exam Triage Vital Signs ED Triage Vitals  Enc Vitals Group     BP 02/20/17 0949 128/74     Pulse Rate 02/20/17 0949 91     Resp --      Temp 02/20/17 0949 98.7 F (37.1 C)     Temp Source 02/20/17 0949 Oral     SpO2 02/20/17 0949 99 %     Weight 02/20/17 0952 174 lb (78.9 kg)     Height 02/20/17 0952 5\' 9"  (1.753 m)     Head Circumference --  Peak Flow --      Pain Score 02/20/17 1116 7     Pain Loc --      Pain Edu? --      Excl. in [place]? --    No data found.   Updated Vital Signs BP 128/74 (BP Location: Left Arm)   Pulse 91   Temp 98.7 F (37.1 C) (Oral)   Ht 5\' 9"  (1.753 m)   Wt 174 lb (78.9 kg)   SpO2 99%   BMI 25.70 kg/m   Visual Acuity Right Eye Distance:   Left Eye Distance:   Bilateral Distance:    Right Eye Near:   Left Eye Near:    Bilateral Near:     Physical Exam  Constitutional: He is oriented to person, place, and time. He appears well-developed and well-nourished. No distress.  HENT:  Head: Normocephalic and atraumatic.  Cardiovascular: Normal rate, regular rhythm, normal heart sounds and intact distal pulses.   No murmur heard. Pulmonary/Chest: Effort normal and breath sounds normal. No respiratory distress. He has no wheezes. He has no rales.  Abdominal:  Soft. Bowel sounds are normal. He exhibits no distension and no mass. There is no tenderness. There is no rebound and no guarding.  Neurological: He is alert and oriented to person, place, and time.  Skin: No rash noted. He is not diaphoretic.  Nursing note and vitals reviewed.    UC Treatments / Results  Labs (all labs ordered are listed, but only abnormal results are displayed) Labs Reviewed  COMPREHENSIVE METABOLIC PANEL    EKG  EKG Interpretation None       Radiology No results found.  Procedures Procedures (including critical care time)  Medications Ordered in UC Medications - No data to display   Initial Impression / Assessment and Plan / UC Course  I have reviewed the triage vital signs and the nursing notes.  Pertinent labs & imaging results that were available during my care of the patient were reviewed by me and considered in my medical decision making (see chart for details).      Final Clinical Impressions(s) / UC Diagnoses   Final diagnoses:  Viral gastroenteritis  Sciatica, left side  Anxiety state    New Prescriptions Discharge Medication List as of 02/20/2017 11:08 AM    START taking these medications   Details  LORazepam (ATIVAN) 1 MG tablet 1 tab po qd prn, Print    predniSONE (DELTASONE) 20 MG tablet Take 1 tablet (20 mg total) by mouth daily., Starting Tue 02/20/2017, Normal       1. Lab results (normal) and diagnosis reviewed with patient 2. rx as per orders above; reviewed possible side effects, interactions, risks and benefits  3. Recommend supportive treatment with clear liquids, then advance slowly as tolerated 4. Follow-up prn if symptoms worsen or don't improve   Controlled Substance Prescriptions [place] Controlled Substance Registry consulted? Not Applicable   Norval Gable, MD 02/20/17 [id number]

## 2017-02-22 ENCOUNTER — Ambulatory Visit
Admission: EM | Admit: 2017-02-22 | Discharge: 2017-02-22 | Disposition: A | Discharge status: Home / Self Care | Payer: BLUE CROSS/BLUE SHIELD | Attending: Family Medicine | Admitting: Family Medicine

## 2017-02-22 ENCOUNTER — Ambulatory Visit (INDEPENDENT_AMBULATORY_CARE_PROVIDER_SITE_OTHER): Payer: BLUE CROSS/BLUE SHIELD

## 2017-02-22 DIAGNOSIS — M5442 Lumbago with sciatica, left side: Secondary | ICD-10-CM

## 2017-02-22 MED ORDER — MELOXICAM 7.5 MG PO TABS: 7.5000 mg | ORAL_TABLET | Freq: Every day | ORAL | 0 refills | Status: AC

## 2017-02-22 NOTE — Discharge Instructions (Signed)
Take medication as prescribed. Rest. Drink plenty of fluids.Avoid strenuous activity.  Follow up with your primary care physician this week. Follow up as scheduled.  Return to Urgent care for new or worsening concerns.

## 2017-02-22 NOTE — ED Triage Notes (Signed)
Patient complains of low back pain. Patient states that he is still having back pain. Patient states that he was seen here on Tuesday and given prednisone. Patient reports that he had extreme anxiety so he stopped the prednisone. Patient states that he is surviving off of castor oil and heating packs. Patient states that today he would like to know what stretches he can do to improve symptoms. Patient is set to see Neurosurgery on Tuesday.

## 2017-02-22 NOTE — ED Provider Notes (Signed)
[facility] ____________________________________________  Time seen: Approximately 12:02 PM  I have reviewed the triage vital signs and the nursing notes.   HISTORY  Chief Complaint Back Pain   HPI Wayne Orr is a 51 y.o. male presenting for evaluation of low back pain that continues. Patient states that he was seen earlier this week in urgent care for the same complaint. Patient reports that he has been applying castor oil and heat packs with some improvement but no resolution. Also states taking over-the-counter ibuprofen intermittently with some improvement, again no resolution. Patient states when he was seen in the urgent care he was prescribed prednisone, but he states that the prednisone makes his anxiety worse so he stopped it. Patient states pain in his low back is an aching pain that does increase with movements. Denies urinary or bowel retention or incontinence. Denies fevers. Patient states that he does have some pain radiation down the left side and back of left buttocks and upper thigh, denies further radiation. Denies paresthesias. Patient also expressed concern of what types of exercises he should be doing for his low back. Patient states that he has been doing some rowing movements on a rowing machine as well as stretching.  Patient states that he has had left low back pain with sciatica for 10+ years but states that doesn't always have pain all across his back. States he and his family have degenerative disc disease. Patient reports over the last 5 months he has had some intermittent low back pain and states recently has been working on a car which may have aggravated his back in addition as he had frequent moving and bending. Patient also states that he has been having muscle cramping and spasms stating in his lower legs. Patient reports in mid July he was started on a statin for his cholesterol, and reports within a few days of starting the statin he started to  have some aching and cramping in his legs. Reports he has stopped taking the statin of the last 10 days and states that overall he feels like the cramping and aching in his legs are improving. Reports he has also had some loose stool which is now since resolved. Denies vomiting. Denies dysuria. Denies fall, direct injury or other trauma. Patient expresses concern that he could have a disc issue in his low back and states that he came in today for an MRI. Patient states that he has an appointment this coming Tuesday with neurosurgery for the same complaints. Patient states that he has told his primary care that he has stopped the statin, and further states he has had similar issues with statins in the past. Patient states that he has not had an x-ray of his low back and many years.   Denies chest pain, shortness of breath, abdominal pain, dysuria, or rash. Denies recent sickness. Denies recent antibiotic use. Denies cardiac history. Denies renal insufficiency.  Wayne Needy, MD: PCP   Past Medical History:  Diagnosis Date  . Anxiety   . Back pain   . Glaucoma   Paroxysmal a fib   There are no active problems to display for this patient.   Past Surgical History:  Procedure Laterality Date  . COLONOSCOPY WITH PROPOFOL N/A 05/19/2016   Procedure: COLONOSCOPY WITH PROPOFOL;  Surgeon: Lollie Sails, MD;  Location: [facility] ENDOSCOPY;  Service: Endoscopy;  Laterality: N/A;  . NO PAST SURGERIES       No current facility-administered medications for this encounter.  Current Outpatient Prescriptions:  .  aspirin 81 MG chewable tablet, Chew by mouth daily., Disp: , Rfl:  .  Aspirin-Caffeine (BAYER BACK & BODY PAIN EX ST) 500-32.5 MG TABS, Take by mouth., Disp: , Rfl:  .  clonazePAM (KLONOPIN) 0.5 MG tablet, Take 0.5 mg by mouth 2 (two) times daily as needed for anxiety., Disp: , Rfl:  .  gemfibrozil (LOPID) 600 MG tablet, Take 600 mg by mouth 2 (two) times daily before a meal., Disp: , Rfl:    .  latanoprost (XALATAN) 0.005 % ophthalmic solution, Place 1 drop into both eyes at bedtime., Disp: , Rfl:  .  LORazepam (ATIVAN) 1 MG tablet, 1 tab po qd prn, Disp: 5 tablet, Rfl: 0 .  metoprolol tartrate (LOPRESSOR) 25 MG tablet, Take 0.5 tablets (12.5 mg total) by mouth 2 (two) times daily., Disp: 30 tablet, Rfl: 0 .  meloxicam (MOBIC) 7.5 MG tablet, Take 1 tablet (7.5 mg total) by mouth daily., Disp: 10 tablet, Rfl: 0  Allergies Crestor [rosuvastatin calcium]; Lipitor [atorvastatin]; and Zocor [simvastatin]   family history Mother: anxiety "degenerative disc disease"  Social History Social History  Substance Use Topics  . Smoking status: Current Every Day Smoker    Packs/day: 0.50    Types: Cigarettes  . Smokeless tobacco: Never Used  . Alcohol use Yes     Comment: rarely/social    Review of Systems Constitutional: No fever/chills Eyes: No visual changes. ENT: No sore throat. Cardiovascular: Denies chest pain. Respiratory: Denies shortness of breath. Gastrointestinal: No abdominal pain.  No nausea, no vomiting. As above.No constipation. Genitourinary: Negative for dysuria. Musculoskeletal: Positive for back pain. Skin: Negative for rash. Neurological: Negative for headaches, focal weakness or numbness.  ____________________________________________   PHYSICAL EXAM:  VITAL SIGNS: ED Triage Vitals  Enc Vitals Group     BP 02/22/17 1136 126/80     Pulse Rate 02/22/17 1136 68     Resp 02/22/17 1136 18     Temp 02/22/17 1136 98.1 F (36.7 C)     Temp Source 02/22/17 1136 Oral     SpO2 02/22/17 1136 100 %     Weight 02/22/17 1135 174 lb (78.9 kg)     Height 02/22/17 1135 '5\' 9"'  (1.753 m)     Head Circumference --      Peak Flow --      Pain Score 02/22/17 1135 8     Pain Loc --      Pain Edu? --      Excl. in [place]? --     Constitutional: Alert and oriented. Well appearing and in no acute distress. Ambulating in room.  Cardiovascular: Normal rate, regular  rhythm. Grossly normal heart sounds.  Good peripheral circulation. Respiratory: Normal respiratory effort without tachypnea nor retractions. Breath sounds are clear and equal bilaterally. No wheezes, rales, rhonchi. Musculoskeletal:  Steady gait. No midline cervical or thoracic tenderness to palpation. Bilateral pedal pulses equal and easily palpated.      Right lower leg:  No tenderness or edema.      Left lower leg:  No tenderness or edema.  Except: Mild mid and para lumbar tenderness to palpation, changes positions quickly in room, no pain with standing bilateral knee lifts, bilateral plantar flexion and dorsi flexion strong and equal, 2+ patellar and Achilles reflexes bilaterally, sensation intact to bilateral lower extremities, no saddle anesthesia, mild pain increase with lumbar flexion and extension, no pain with lumbar rotation, full range of motion present. Neurologic:  Normal speech and  language. No gross focal neurologic deficits are appreciated. Speech is normal. No gait instability.  Skin:  Skin is warm, dry  Psychiatric: Mood and affect are normal. Speech and behavior are normal. Patient exhibits appropriate insight and judgment   ___________________________________________   LABS (all labs ordered are listed, but only abnormal results are displayed)  Labs Reviewed - No data to display Recent Results (from the past 2160 hour(s))  Comprehensive metabolic panel     Status: None   Collection Time: 02/20/17 10:27 AM  Result Value Ref Range   Sodium 136 135 - 145 mmol/L   Potassium 4.3 3.5 - 5.1 mmol/L   Chloride 102 101 - 111 mmol/L   CO2 28 22 - 32 mmol/L   Glucose, Bld 98 65 - 99 mg/dL   BUN 16 6 - 20 mg/dL   Creatinine, Ser 1.14 0.61 - 1.24 mg/dL   Calcium 9.3 8.9 - 10.3 mg/dL   Total Protein 7.8 6.5 - 8.1 g/dL   Albumin 4.5 3.5 - 5.0 g/dL   AST 22 15 - 41 U/L   ALT 30 17 - 63 U/L   Alkaline Phosphatase 78 38 - 126 U/L   Total Bilirubin 0.8 0.3 - 1.2 mg/dL   GFR calc  non Af Amer >60 >60 mL/min   GFR calc Af Amer >60 >60 mL/min    Comment: (NOTE) The eGFR has been calculated using the CKD EPI equation. This calculation has not been validated in all clinical situations. eGFR's persistently <60 mL/min signify possible Chronic Kidney Disease.    Anion gap 6 5 - 15     RADIOLOGY  Dg Lumbar Spine Complete  Result Date: 02/22/2017 CLINICAL DATA:  Low back pain radiating to both lower extremities for 10 days. No known injury. EXAM: LUMBAR SPINE - COMPLETE 4+ VIEW COMPARISON:  None. FINDINGS: There is no evidence of lumbar spine fracture. Alignment is normal. Intervertebral disc spaces are maintained. No other osseous abnormality identified. IMPRESSION: Negative. Electronically Signed   By: Earle Gell M.D.   On: 02/22/2017 13:07   ____________________________________________   PROCEDURES Procedures     INITIAL IMPRESSION / ASSESSMENT AND PLAN / ED COURSE  Pertinent labs & imaging results that were available during my care of the patient were reviewed by me and considered in my medical decision making (see chart for details).  Very well-appearing patient. No acute distress. Patient does seem somewhat anxious as well as he expresses anxiety verbally. Presents for chief complaint of low back pain. No focal neurological deficits. Patient does have mild diffuse low lower lumbar and paralumbar tenderness. As no recent x-rays, will evaluate lumbar x-ray. Suspect patient may have had side effects from statin as he reports the muscle spasm and cramping has improved, but discussed with patient may have strained low back from compensation or working. Discussed the patient will evaluate lumbar x-ray at this time, patient agrees.   Lumbar x-ray reviewed, per radiologist negative. Discussed with patient and recommend patient to follow-up with his primary and his neurosurgery has scheduled. Discussed with patient we'll treat with oral mobic daily. Discussed supportive  care and as avoidance of strenuous activity. Discussed supportive stretching, yoga. Also discussed with patient evaluation of labs, as patient reporting muscle spasms have improved and diarrhea resolved, reviewed labs from 2 days ago, and defer labs at this time, patient agrees. Encourage close follow-up. Discussed very strict follow-up and return parameters with patient.Discussed indication, risks and benefits of medications with patient.  Discussed follow up with Primary  care physician this week. Discussed follow up and return parameters including no resolution or any worsening concerns. Patient verbalized understanding and agreed to plan.   ____________________________________________   FINAL CLINICAL IMPRESSION(S) / ED DIAGNOSES  Final diagnoses:  Acute bilateral low back pain with left-sided sciatica     Discharge Medication List as of 02/22/2017  1:18 PM    START taking these medications   Details  meloxicam (MOBIC) 7.5 MG tablet Take 1 tablet (7.5 mg total) by mouth daily., Starting Thu 02/22/2017, Normal        Note: This dictation was prepared with Dragon dictation along with smaller phrase technology. Any transcriptional errors that result from this process are unintentional.         Marylene Land, NP 02/22/17 1527

## 2017-02-23 ENCOUNTER — Telehealth: Payer: Self-pay

## 2017-02-23 MED ORDER — METAXALONE 800 MG PO TABS: 800.0000 mg | ORAL_TABLET | Freq: Three times a day (TID) | ORAL | 0 refills | Status: AC

## 2017-02-23 NOTE — Telephone Encounter (Signed)
Patient called in today and reports little relief with use of Mobic. Patient states that he would like a muscle relaxer. Spoke with Crecencio Mc, PA-C approved for patient to receive Skelaxin which I have sent over to [place] per patient request. [facility]

## 2017-03-01 ENCOUNTER — Other Ambulatory Visit (HOSPITAL_COMMUNITY): Payer: Self-pay | Admitting: Student

## 2017-03-01 DIAGNOSIS — M5416 Radiculopathy, lumbar region: Secondary | ICD-10-CM

## 2017-03-06 ENCOUNTER — Other Ambulatory Visit (HOSPITAL_COMMUNITY): Payer: BLUE CROSS/BLUE SHIELD

## 2017-03-06 ENCOUNTER — Ambulatory Visit: Payer: BLUE CROSS/BLUE SHIELD

## 2017-03-21 ENCOUNTER — Ambulatory Visit
Admission: EM | Admit: 2017-03-21 | Discharge: 2017-03-21 | Disposition: A | Discharge status: Home / Self Care | Payer: BLUE CROSS/BLUE SHIELD | Attending: Family Medicine | Admitting: Family Medicine

## 2017-03-21 DIAGNOSIS — L03116 Cellulitis of left lower limb: Secondary | ICD-10-CM | POA: Diagnosis not present

## 2017-03-21 DIAGNOSIS — S80862A Insect bite (nonvenomous), left lower leg, initial encounter: Secondary | ICD-10-CM

## 2017-03-21 LAB — CBC WITH DIFFERENTIAL/PLATELET
BASOS PCT: 1 %
Basophils Absolute: 0.1 10*3/uL (ref 0–0.1)
EOS ABS: 0.1 10*3/uL (ref 0–0.7)
EOS PCT: 1 %
HCT: 47.2 % (ref 40.0–52.0)
HEMOGLOBIN: 16.4 g/dL (ref 13.0–18.0)
LYMPHS ABS: 1.9 10*3/uL (ref 1.0–3.6)
Lymphocytes Relative: 17 %
MCH: 30 pg (ref 26.0–34.0)
MCHC: 34.8 g/dL (ref 32.0–36.0)
MCV: 86.3 fL (ref 80.0–100.0)
MONO ABS: 0.6 10*3/uL (ref 0.2–1.0)
MONOS PCT: 5 %
Neutro Abs: 8.7 10*3/uL — ABNORMAL HIGH (ref 1.4–6.5)
Neutrophils Relative %: 76 %
PLATELETS: 275 10*3/uL (ref 150–440)
RBC: 5.47 MIL/uL (ref 4.40–5.90)
RDW: 12.7 % (ref 11.5–14.5)
WBC: 11.2 10*3/uL — AB (ref 3.8–10.6)

## 2017-03-21 MED ORDER — DOXYCYCLINE HYCLATE 100 MG PO TABS: 100.0000 mg | ORAL_TABLET | Freq: Two times a day (BID) | ORAL | 0 refills | Status: AC

## 2017-03-21 NOTE — ED Triage Notes (Signed)
Patient complains of bite to left lower leg. Patient states that he did have some bruising and redness 3 days. Patient states that he noticed the area 2 days ago. Patient also reports that he is not sleeping well due to muscles in his back.

## 2017-03-21 NOTE — ED Provider Notes (Signed)
[facility]    CSN: [id number] Arrival date & time: 03/21/17  1325     History   Chief Complaint Chief Complaint  Patient presents with  . Insect Bite    HPI Wayne Orr is a 51 y.o. male.   51 yo male with a c/o rash, redness and pain to his left lower leg skin. States he thinks that he was bitten by something and not sure if tick bite. States would like to get checked for Lyme. Denies any fevers, chills, drainage.    The history is provided by the patient.    Past Medical History:  Diagnosis Date  . Anxiety   . Back pain   . Glaucoma   . Glaucoma     There are no active problems to display for this patient.   Past Surgical History:  Procedure Laterality Date  . COLONOSCOPY WITH PROPOFOL N/A 05/19/2016   Procedure: COLONOSCOPY WITH PROPOFOL;  Surgeon: Lollie Sails, MD;  Location: [facility] ENDOSCOPY;  Service: Endoscopy;  Laterality: N/A;  . NO PAST SURGERIES         Home Medications    Prior to Admission medications   Medication Sig Start Date End Date Taking? Authorizing Provider  aspirin 81 MG chewable tablet Chew by mouth daily.   Yes [provider]  Aspirin-Caffeine (BAYER BACK & BODY PAIN EX ST) 500-32.5 MG TABS Take by mouth.   Yes [provider]  clonazePAM (KLONOPIN) 0.5 MG tablet Take 0.5 mg by mouth 2 (two) times daily as needed for anxiety.   Yes [provider]  latanoprost (XALATAN) 0.005 % ophthalmic solution Place 1 drop into both eyes at bedtime.   Yes [provider]  magnesium oxide (MAG-OX) 400 MG tablet Take 400 mg by mouth daily.   Yes [provider]  vitamin B-12 (CYANOCOBALAMIN) 100 MCG tablet Take 100 mcg by mouth daily.   Yes [provider]  doxycycline (VIBRA-TABS) 100 MG tablet Take 1 tablet (100 mg total) by mouth 2 (two) times daily. 03/21/17   Norval Gable, MD  gemfibrozil (LOPID) 600 MG tablet Take 600 mg by mouth 2 (two) times daily before a meal.     [provider]  LORazepam (ATIVAN) 1 MG tablet 1 tab po qd prn 02/20/17   Norval Gable, MD  meloxicam (MOBIC) 7.5 MG tablet Take 1 tablet (7.5 mg total) by mouth daily. 02/22/17   Marylene Land, NP  metaxalone (SKELAXIN) 800 MG tablet Take 1 tablet (800 mg total) by mouth 3 (three) times daily. 02/23/17   Lorin Picket, PA-C  metoprolol tartrate (LOPRESSOR) 25 MG tablet Take 0.5 tablets (12.5 mg total) by mouth 2 (two) times daily. 09/24/16 09/24/17  Harvest Dark, MD    Family History History reviewed. No pertinent family history.  Social History Social History  Substance Use Topics  . Smoking status: Current Every Day Smoker    Packs/day: 0.50    Types: Cigarettes  . Smokeless tobacco: Never Used  . Alcohol use Yes     Comment: rarely/social     Allergies   Crestor [rosuvastatin calcium]; Lipitor [atorvastatin]; and Zocor [simvastatin]   Review of Systems Review of Systems   Physical Exam Triage Vital Signs ED Triage Vitals  Enc Vitals Group     BP 03/21/17 1511 135/75     Pulse Rate 03/21/17 1511 79     Resp 03/21/17 1511 18     Temp 03/21/17 1511 98.4 F (36.9 C)  Temp Source 03/21/17 1511 Oral     SpO2 03/21/17 1511 100 %     Weight 03/21/17 1512 174 lb (78.9 kg)     Height 03/21/17 1512 5\' 9"  (1.753 m)     Head Circumference --      Peak Flow --      Pain Score 03/21/17 1512 5     Pain Loc --      Pain Edu? --      Excl. in [place]? --    No data found.   Updated Vital Signs BP 135/75 (BP Location: Left Arm)   Pulse 79   Temp 98.4 F (36.9 C) (Oral)   Resp 18   Ht 5\' 9"  (1.753 m)   Wt 174 lb (78.9 kg)   SpO2 100%   BMI 25.70 kg/m   Visual Acuity Right Eye Distance:   Left Eye Distance:   Bilateral Distance:    Right Eye Near:   Left Eye Near:    Bilateral Near:     Physical Exam  Constitutional: He appears well-developed and well-nourished. No distress.  Skin: He is not diaphoretic. There is erythema.     Left lower  calf area skin erythema and tenderness to palpation  Nursing note and vitals reviewed.    UC Treatments / Results  Labs (all labs ordered are listed, but only abnormal results are displayed) Labs Reviewed  CBC WITH DIFFERENTIAL/PLATELET - Abnormal; Notable for the following:       Result Value   WBC 11.2 (*)    Neutro Abs 8.7 (*)    All other components within normal limits  B. BURGDORFI ANTIBODIES  ROCKY MTN SPOTTED FVR ABS PNL(IGG+IGM)    EKG  EKG Interpretation None       Radiology No results found.  Procedures Procedures (including critical care time)  Medications Ordered in UC Medications - No data to display   Initial Impression / Assessment and Plan / UC Course  I have reviewed the triage vital signs and the nursing notes.  Pertinent labs & imaging results that were available during my care of the patient were reviewed by me and considered in my medical decision making (see chart for details).       Final Clinical Impressions(s) / UC Diagnoses   Final diagnoses:  Cellulitis of left lower extremity    New Prescriptions Discharge Medication List as of 03/21/2017  4:23 PM    START taking these medications   Details  doxycycline (VIBRA-TABS) 100 MG tablet Take 1 tablet (100 mg total) by mouth 2 (two) times daily., Starting Wed 03/21/2017, Normal       1. Lab results and diagnosis reviewed with patient 2. rx as per orders above; reviewed possible side effects, interactions, risks and benefits  3. Recommend supportive treatment with warm compresses to area 4. Check Lyme/RMSF  4. Follow-up prn if symptoms worsen or don't improve  Controlled Substance Prescriptions [place] Controlled Substance Registry consulted? Not Applicable   Norval Gable, MD 03/21/17 1750

## 2017-03-22 LAB — B. BURGDORFI ANTIBODIES: B burgdorferi Ab IgG+IgM: <0.91 {ISR} (ref 0.00–0.90)

## 2017-03-23 LAB — ROCKY MTN SPOTTED FVR ABS PNL(IGG+IGM)
RMSF IGM: 0.18 {index} (ref 0.00–0.89)
RMSF IgG: NEGATIVE

## 2017-05-17 DEATH — deceased
